# Patient Record
Sex: Female | Born: 1990 | Race: Black or African American | Hispanic: No | Marital: Single | Smoking: Former smoker
Health system: Southern US, Community
[De-identification: ages and names within clinical notes are randomized; demographics above are authoritative.]

## PROBLEM LIST (undated history)

## (undated) ENCOUNTER — Inpatient Hospital Stay (HOSPITAL_COMMUNITY): Payer: Self-pay

## (undated) DIAGNOSIS — Z789 Other specified health status: Secondary | ICD-10-CM

## (undated) HISTORY — PX: NO PAST SURGERIES: SHX2092

---

## 2011-12-24 ENCOUNTER — Other Ambulatory Visit: Payer: Self-pay | Admitting: Family Medicine

## 2011-12-24 DIAGNOSIS — M25569 Pain in unspecified knee: Secondary | ICD-10-CM

## 2012-01-01 ENCOUNTER — Telehealth: Payer: Self-pay | Admitting: Family Medicine

## 2012-01-01 ENCOUNTER — Ambulatory Visit
Admission: RE | Admit: 2012-01-01 | Discharge: 2012-01-01 | Disposition: A | Discharge status: Home / Self Care | Payer: BC Managed Care – PPO | Source: Ambulatory Visit | Attending: Internal Medicine | Admitting: Internal Medicine

## 2012-01-01 DIAGNOSIS — M25569 Pain in unspecified knee: Secondary | ICD-10-CM

## 2012-01-01 NOTE — Telephone Encounter (Signed)
Called patient about her MRI results.  Will f/u with me at [facility] sports clinic Monday afternoon.

## 2015-03-31 ENCOUNTER — Encounter (HOSPITAL_COMMUNITY): Payer: Self-pay | Admitting: Emergency Medicine

## 2015-03-31 ENCOUNTER — Emergency Department (HOSPITAL_COMMUNITY): Payer: BLUE CROSS/BLUE SHIELD

## 2015-03-31 ENCOUNTER — Emergency Department (HOSPITAL_COMMUNITY)
Admission: EM | Admit: 2015-03-31 | Discharge: 2015-03-31 | Disposition: A | Discharge status: Home / Self Care | Payer: BLUE CROSS/BLUE SHIELD | Attending: Emergency Medicine | Admitting: Emergency Medicine

## 2015-03-31 DIAGNOSIS — Y9389 Activity, other specified: Secondary | ICD-10-CM | POA: Diagnosis not present

## 2015-03-31 DIAGNOSIS — S96911A Strain of unspecified muscle and tendon at ankle and foot level, right foot, initial encounter: Secondary | ICD-10-CM | POA: Diagnosis not present

## 2015-03-31 DIAGNOSIS — S3991XA Unspecified injury of abdomen, initial encounter: Secondary | ICD-10-CM | POA: Insufficient documentation

## 2015-03-31 DIAGNOSIS — S20312A Abrasion of left front wall of thorax, initial encounter: Secondary | ICD-10-CM | POA: Insufficient documentation

## 2015-03-31 DIAGNOSIS — S39012A Strain of muscle, fascia and tendon of lower back, initial encounter: Secondary | ICD-10-CM | POA: Insufficient documentation

## 2015-03-31 DIAGNOSIS — S99911A Unspecified injury of right ankle, initial encounter: Secondary | ICD-10-CM | POA: Diagnosis present

## 2015-03-31 DIAGNOSIS — Z3202 Encounter for pregnancy test, result negative: Secondary | ICD-10-CM | POA: Insufficient documentation

## 2015-03-31 DIAGNOSIS — Y998 Other external cause status: Secondary | ICD-10-CM | POA: Insufficient documentation

## 2015-03-31 DIAGNOSIS — Y92410 Unspecified street and highway as the place of occurrence of the external cause: Secondary | ICD-10-CM | POA: Insufficient documentation

## 2015-03-31 LAB — POC URINE PREG, ED: Preg Test, Ur: NEGATIVE

## 2015-03-31 MED ORDER — METHOCARBAMOL 500 MG PO TABS
500.0000 mg | ORAL_TABLET | Freq: Once | ORAL | Status: AC
  Administered 2015-03-31: 500 mg via ORAL
  Filled 2015-03-31: qty 1

## 2015-03-31 MED ORDER — METHOCARBAMOL 500 MG PO TABS: 500.0000 mg | ORAL_TABLET | Freq: Two times a day (BID) | ORAL | Status: DC

## 2015-03-31 MED ORDER — NAPROXEN 500 MG PO TABS
500.0000 mg | ORAL_TABLET | Freq: Once | ORAL | Status: AC
  Administered 2015-03-31: 500 mg via ORAL
  Filled 2015-03-31: qty 1

## 2015-03-31 MED ORDER — NAPROXEN 500 MG PO TABS: 500.0000 mg | ORAL_TABLET | Freq: Two times a day (BID) | ORAL | Status: DC
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
## 2015-03-31 NOTE — ED Notes (Signed)
Patient transported to X-ray 

## 2015-03-31 NOTE — ED Notes (Signed)
Pt denies LOC, c/o tenderness to lower abdomen with palpation by Tresa EndoKelly, PA-C

## 2016-01-02 ENCOUNTER — Encounter (HOSPITAL_COMMUNITY): Payer: Self-pay

## 2016-01-02 ENCOUNTER — Emergency Department (HOSPITAL_COMMUNITY)
Admission: EM | Admit: 2016-01-02 | Discharge: 2016-01-02 | Disposition: A | Discharge status: Home / Self Care | Payer: BLUE CROSS/BLUE SHIELD | Attending: Emergency Medicine | Admitting: Emergency Medicine

## 2016-01-02 DIAGNOSIS — Z79899 Other long term (current) drug therapy: Secondary | ICD-10-CM | POA: Diagnosis not present

## 2016-01-02 DIAGNOSIS — H6691 Otitis media, unspecified, right ear: Secondary | ICD-10-CM | POA: Insufficient documentation

## 2016-01-02 DIAGNOSIS — H9201 Otalgia, right ear: Secondary | ICD-10-CM | POA: Diagnosis present

## 2016-01-02 MED ORDER — AMOXICILLIN-POT CLAVULANATE 875-125 MG PO TABS: 1.0000 | ORAL_TABLET | Freq: Two times a day (BID) | ORAL | 0 refills | Status: DC

## 2016-01-02 MED ORDER — AMOXICILLIN-POT CLAVULANATE 875-125 MG PO TABS
1.0000 | ORAL_TABLET | Freq: Once | ORAL | Status: AC
Start: 2016-01-02 — End: 2016-01-02
  Administered 2016-01-02: 1 via ORAL
  Filled 2016-01-02: qty 1

## 2016-01-02 MED ORDER — IBUPROFEN 800 MG PO TABS
800.0000 mg | ORAL_TABLET | Freq: Once | ORAL | Status: AC
  Administered 2016-01-02: 800 mg via ORAL
  Filled 2016-01-02: qty 1

## 2016-01-02 MED ORDER — IBUPROFEN 600 MG PO TABS
600.0000 mg | ORAL_TABLET | Freq: Four times a day (QID) | ORAL | 0 refills | Status: DC | PRN
Start: 2016-01-02 — End: 2017-09-28

## 2016-01-02 NOTE — ED Triage Notes (Signed)
Patient c/o ear pain that began today.  Patient states that ear feels like it is going to "pop".  Patient states has been up all night with pain.  Rates pain10/10

## 2016-01-02 NOTE — ED Provider Notes (Signed)
[facility]-EMERGENCY DEPT Provider Note   CSN: [id number] Arrival date & time: 01/02/16  [id number]     History   Chief Complaint Chief Complaint  Patient presents with  . Otalgia    HPI Alice Schmidt is a 25 y.o. female.  25 year old female presents to the emergency department for evaluation of sudden onset of right ear pain which began today. Symptoms preceded by upper respiratory symptoms including headaches, nasal congestion, and sore throat. Patient denies taking any medication for symptoms. No fevers noted prior to arrival.   The history is provided by the patient. No language interpreter was used.  Otalgia  This is a new problem. The current episode started 6 to 12 hours ago. There is pain in the right ear. The problem occurs constantly. The problem has been gradually worsening. There has been no fever. The pain is at a severity of 10/10. The pain is moderate. Associated symptoms include headaches and sore throat. Pertinent negatives include no ear discharge, no neck pain and no rash. Her past medical history does not include chronic ear infection or hearing loss.    History reviewed. No pertinent past medical history.  There are no active problems to display for this patient.   History reviewed. No pertinent surgical history.  OB History    No data available       Home Medications    Prior to Admission medications   Medication Sig Start Date End Date Taking? Authorizing Provider  fluocinonide ointment (LIDEX) 0.05 % Apply 1 application topically 2 (two) times daily as needed (for ezcema.).   Yes Historical Provider, MD  ONEXTON 1.2-3.75 % GEL Apply 1 application topically every morning. 12/23/15  Yes Historical Provider, MD  RETIN-A MICRO PUMP 0.08 % GEL Apply 1 application topically at bedtime. 12/23/15  Yes Historical Provider, MD  amoxicillin-clavulanate (AUGMENTIN) 875-125 MG tablet Take 1 tablet by mouth every 12 (twelve) hours. 01/02/16   Antony MaduraKelly Hagan Vanauken, PA-C  ibuprofen  (ADVIL,MOTRIN) 600 MG tablet Take 1 tablet (600 mg total) by mouth every 6 (six) hours as needed. 01/02/16   Antony MaduraKelly Enya Bureau, PA-C    Family History No family history on file.  Social History Social History  Substance Use Topics  . Smoking status: Never Smoker  . Smokeless tobacco: Never Used  . Alcohol use No     Allergies   Review of patient's allergies indicates no known allergies.   Review of Systems Review of Systems  HENT: Positive for ear pain and sore throat. Negative for ear discharge.   Musculoskeletal: Negative for neck pain.  Skin: Negative for rash.  Neurological: Positive for headaches.  Ten systems reviewed and are negative for acute change, except as noted in the HPI.     Physical Exam Updated Vital Signs BP 120/89 (BP Location: Left Arm)   Pulse 92   Temp 98.1 F (36.7 C) (Oral)   Resp 19   Ht 5\' 4"  (1.626 m)   Wt 54 kg   LMP 12/30/2015   SpO2 100%   BMI 20.43 kg/m   Physical Exam  Constitutional: She is oriented to person, place, and time. She appears well-developed and well-nourished. No distress.  Nontoxic appearing. Tearful.  HENT:  Head: Normocephalic and atraumatic.  Patient with erythematous and dull, slightly bulging right tympanic membrane. No evidence of perforation. No mastoid swelling, tenderness, or erythema bilaterally. No hemotympanum. Uvula midline. Patient tolerating secretions without difficulty. Oropharynx without exudates. No tripoding.  Eyes: Conjunctivae and EOM are normal. No scleral icterus.  Neck:  Normal range of motion.  No nuchal rigidity or meningismus  Cardiovascular: Normal rate, regular rhythm and intact distal pulses.   Pulmonary/Chest: Effort normal. No respiratory distress.  Respirations even and unlabored  Musculoskeletal: Normal range of motion.  Neurological: She is alert and oriented to person, place, and time.  Skin: Skin is warm and dry. No rash noted. She is not diaphoretic. No erythema. No pallor.    Psychiatric: Her behavior is normal.  Nursing note and vitals reviewed.    ED Treatments / Results  Labs (all labs ordered are listed, but only abnormal results are displayed) Labs Reviewed - No data to display  EKG  EKG Interpretation None       Radiology No results found.  Procedures Procedures (including critical care time)  Medications Ordered in ED Medications  ibuprofen (ADVIL,MOTRIN) tablet 800 mg (800 mg Oral Given 01/02/16 0449)  amoxicillin-clavulanate (AUGMENTIN) 875-125 MG per tablet 1 tablet (1 tablet Oral Given 01/02/16 0449)     Initial Impression / Assessment and Plan / ED Course  I have reviewed the triage vital signs and the nursing notes.  Pertinent labs & imaging results that were available during my care of the patient were reviewed by me and considered in my medical decision making (see chart for details).  Clinical Course    Patient presents with otalgia and exam consistent with acute otitis media. No concern for acute mastoiditis, meningitis. No antibiotic use in the last month. Patient discharged home with Augmentin. Advised patient to call PCP today for follow-up. I have also discussed reasons to return immediately to the ER. Patient expresses understanding and agrees with plan. Patient discharged in stable condition with no unaddressed concerns.   Final Clinical Impressions(s) / ED Diagnoses   Final diagnoses:  Right otitis media, recurrence not specified, unspecified chronicity, unspecified otitis media type    New Prescriptions New Prescriptions   AMOXICILLIN-CLAVULANATE (AUGMENTIN) 875-125 MG TABLET    Take 1 tablet by mouth every 12 (twelve) hours.   IBUPROFEN (ADVIL,MOTRIN) 600 MG TABLET    Take 1 tablet (600 mg total) by mouth every 6 (six) hours as needed.     Antony Madura, PA-C 01/02/16 1610    Devoria Albe, MD 01/02/16 [id number]

## 2016-01-20 ENCOUNTER — Other Ambulatory Visit (HOSPITAL_COMMUNITY)
Admission: RE | Admit: 2016-01-20 | Discharge: 2016-01-20 | Disposition: A | Discharge status: Home / Self Care | Payer: BLUE CROSS/BLUE SHIELD | Source: Ambulatory Visit | Attending: Family Medicine | Admitting: Family Medicine

## 2016-01-20 ENCOUNTER — Other Ambulatory Visit: Payer: Self-pay | Admitting: Family Medicine

## 2016-01-20 DIAGNOSIS — Z113 Encounter for screening for infections with a predominantly sexual mode of transmission: Secondary | ICD-10-CM | POA: Insufficient documentation

## 2016-01-20 DIAGNOSIS — Z01419 Encounter for gynecological examination (general) (routine) without abnormal findings: Secondary | ICD-10-CM | POA: Insufficient documentation

## 2016-01-23 LAB — CYTOLOGY - PAP

## 2017-09-28 ENCOUNTER — Inpatient Hospital Stay (HOSPITAL_COMMUNITY)
Admission: AD | Admit: 2017-09-28 | Discharge: 2017-09-28 | Disposition: A | Discharge status: Home / Self Care | Payer: Self-pay | Source: Ambulatory Visit | Attending: Obstetrics and Gynecology | Admitting: Obstetrics and Gynecology

## 2017-09-28 ENCOUNTER — Encounter (HOSPITAL_COMMUNITY): Payer: Self-pay | Admitting: *Deleted

## 2017-09-28 ENCOUNTER — Inpatient Hospital Stay (HOSPITAL_COMMUNITY): Payer: Self-pay

## 2017-09-28 DIAGNOSIS — Z87891 Personal history of nicotine dependence: Secondary | ICD-10-CM | POA: Insufficient documentation

## 2017-09-28 DIAGNOSIS — O021 Missed abortion: Secondary | ICD-10-CM | POA: Insufficient documentation

## 2017-09-28 DIAGNOSIS — Z3A08 8 weeks gestation of pregnancy: Secondary | ICD-10-CM

## 2017-09-28 HISTORY — DX: Other specified health status: Z78.9

## 2017-09-28 LAB — WET PREP, GENITAL
Clue Cells Wet Prep HPF POC: NONE SEEN
Sperm: NONE SEEN
TRICH WET PREP: NONE SEEN
Yeast Wet Prep HPF POC: NONE SEEN

## 2017-09-28 LAB — CBC
HCT: 38.1 % (ref 36.0–46.0)
Hemoglobin: 13.1 g/dL (ref 12.0–15.0)
MCH: 31.6 pg (ref 26.0–34.0)
MCHC: 34.4 g/dL (ref 30.0–36.0)
MCV: 91.8 fL (ref 78.0–100.0)
PLATELETS: 228 10*3/uL (ref 150–400)
RBC: 4.15 MIL/uL (ref 3.87–5.11)
RDW: 13.9 % (ref 11.5–15.5)
WBC: 4.3 10*3/uL (ref 4.0–10.5)

## 2017-09-28 LAB — URINALYSIS, ROUTINE W REFLEX MICROSCOPIC
Bilirubin Urine: NEGATIVE
GLUCOSE, UA: NEGATIVE mg/dL
KETONES UR: NEGATIVE mg/dL
Nitrite: NEGATIVE
PROTEIN: NEGATIVE mg/dL
Specific Gravity, Urine: 1.025 (ref 1.005–1.030)
pH: 6 (ref 5.0–8.0)

## 2017-09-28 LAB — HCG, QUANTITATIVE, PREGNANCY: HCG, BETA CHAIN, QUANT, S: 12226 m[IU]/mL — AB (ref <5)

## 2017-09-28 LAB — ABO/RH: ABO/RH(D): A POS

## 2017-09-28 LAB — POCT PREGNANCY, URINE: PREG TEST UR: POSITIVE — AB

## 2017-09-28 MED ORDER — PROMETHAZINE HCL 25 MG PO TABS: 25.0000 mg | ORAL_TABLET | Freq: Four times a day (QID) | ORAL | 0 refills | Status: AC | PRN

## 2017-09-28 MED ORDER — IBUPROFEN 600 MG PO TABS: 600.0000 mg | ORAL_TABLET | Freq: Four times a day (QID) | ORAL | 0 refills | Status: AC | PRN

## 2017-09-28 MED ORDER — MISOPROSTOL 200 MCG PO TABS: 800.0000 ug | ORAL_TABLET | Freq: Once | ORAL | 0 refills | Status: AC

## 2017-09-28 MED ORDER — OXYCODONE-ACETAMINOPHEN 5-325 MG PO TABS: 1.0000 | ORAL_TABLET | Freq: Four times a day (QID) | ORAL | 0 refills | Status: AC | PRN

## 2017-09-28 NOTE — Discharge Instructions (Signed)

## 2017-09-28 NOTE — MAU Note (Signed)
Found out this past wk I am pregnant. Mild cramping last night but none now. Some bright red spotting this am.

## 2017-09-28 NOTE — MAU Provider Note (Addendum)
History     CSN: [id number]  Arrival date and time: 09/28/17 5621   First Provider Initiated Contact with Patient 09/28/17 [phone]      Chief Complaint  Patient presents with  . Vaginal Bleeding   Vaginal Bleeding  The patient's primary symptoms include pelvic pain and vaginal bleeding. This is a new problem. The current episode started yesterday. The problem occurs intermittently. The problem has been unchanged. Pain severity now: 4/10. The problem affects both sides. She is pregnant. Pertinent negatives include no chills, dysuria, fever, frequency, nausea, urgency or vomiting. The vaginal discharge was bloody. The vaginal bleeding is spotting. She has not been passing clots. She has not been passing tissue. Nothing aggravates the symptoms. She has tried nothing for the symptoms. She uses nothing for contraception. Her menstrual history has been regular (LMP 07/13/17).   Past Medical History:  Diagnosis Date  . Medical history non-contributory     Past Surgical History:  Procedure Laterality Date  . NO PAST SURGERIES      No family history on file.  Social History   Tobacco Use  . Smoking status: Former Games developer  . Smokeless tobacco: Never Used  Substance Use Topics  . Alcohol use: Yes  . Drug use: Yes    Types: Marijuana    Comment: last smoked about 9 days    Allergies: No Known Allergies  Medications Prior to Admission  Medication Sig Dispense Refill Last Dose  . amoxicillin-clavulanate (AUGMENTIN) 875-125 MG tablet Take 1 tablet by mouth every 12 (twelve) hours. 14 tablet 0   . fluocinonide ointment (LIDEX) 0.05 % Apply 1 application topically 2 (two) times daily as needed (for ezcema.).   01/01/2016 at Unknown time  . ibuprofen (ADVIL,MOTRIN) 600 MG tablet Take 1 tablet (600 mg total) by mouth every 6 (six) hours as needed. 30 tablet 0   . ONEXTON 1.2-3.75 % GEL Apply 1 application topically every morning.  3 12/29/2015  . RETIN-A MICRO PUMP 0.08 % GEL Apply 1  application topically at bedtime.  2 01/01/2016 at Unknown time    Review of Systems  Constitutional: Negative for chills and fever.  Gastrointestinal: Negative for nausea and vomiting.  Genitourinary: Positive for pelvic pain and vaginal bleeding. Negative for dysuria, frequency and urgency.   Physical Exam   Blood pressure 101/65, pulse 81, temperature 98.2 F (36.8 C), resp. rate 18, height 5\' 4"  (1.626 m), weight 119 lb (54 kg), last menstrual period 07/13/2017.  Physical Exam  Nursing note and vitals reviewed. Constitutional: She is oriented to person, place, and time. She appears well-developed and well-nourished. No distress.  HENT:  Head: Normocephalic.  Cardiovascular: Normal rate.  Respiratory: Effort normal.  GI: Soft. There is no tenderness. There is no rebound.  Neurological: She is alert and oriented to person, place, and time.  Skin: Skin is warm and dry.  Psychiatric: She has a normal mood and affect.    MAU Course  Procedures Results for orders placed or performed during the hospital encounter of 09/28/17 (from the past 24 hour(s))  Urinalysis, Routine w reflex microscopic     Status: Abnormal   Collection Time: 09/28/17  6:45 AM  Result Value Ref Range   Color, Urine YELLOW YELLOW   APPearance HAZY (A) CLEAR   Specific Gravity, Urine 1.025 1.005 - 1.030   pH 6.0 5.0 - 8.0   Glucose, UA NEGATIVE NEGATIVE mg/dL   Hgb urine dipstick MODERATE (A) NEGATIVE   Bilirubin Urine NEGATIVE NEGATIVE   Ketones,  ur NEGATIVE NEGATIVE mg/dL   Protein, ur NEGATIVE NEGATIVE mg/dL   Nitrite NEGATIVE NEGATIVE   Leukocytes, UA SMALL (A) NEGATIVE   RBC / HPF 6-10 0 - 5 RBC/hpf   WBC, UA 6-10 0 - 5 WBC/hpf   Bacteria, UA MANY (A) NONE SEEN   Squamous Epithelial / LPF 6-10 0 - 5   Mucus PRESENT   Pregnancy, urine POC     Status: Abnormal   Collection Time: 09/28/17  7:00 AM  Result Value Ref Range   Preg Test, Ur POSITIVE (A) NEGATIVE  CBC     Status: None   Collection  Time: 09/28/17  7:34 AM  Result Value Ref Range   WBC 4.3 4.0 - 10.5 K/uL   RBC 4.15 3.87 - 5.11 MIL/uL   Hemoglobin 13.1 12.0 - 15.0 g/dL   HCT 14.7 82.9 - 56.2 %   MCV 91.8 78.0 - 100.0 fL   MCH 31.6 26.0 - 34.0 pg   MCHC 34.4 30.0 - 36.0 g/dL   RDW 13.0 86.5 - 78.4 %   Platelets 228 150 - 400 K/uL  hCG, quantitative, pregnancy     Status: Abnormal   Collection Time: 09/28/17  7:34 AM  Result Value Ref Range   hCG, Beta Chain, Quant, S 12,226 (H) <5 mIU/mL  ABO/Rh     Status: None   Collection Time: 09/28/17  7:34 AM  Result Value Ref Range   ABO/RH(D)      A POS Performed at [facility], [address], [place], [place] [address]   Wet prep, genital     Status: Abnormal   Collection Time: 09/28/17  7:35 AM  Result Value Ref Range   Yeast Wet Prep HPF POC NONE SEEN NONE SEEN   Trich, Wet Prep NONE SEEN NONE SEEN   Clue Cells Wet Prep HPF POC NONE SEEN NONE SEEN   WBC, Wet Prep HPF POC FEW (A) NONE SEEN   Sperm NONE SEEN    US Ob Comp Less 14 Wks  Result Date: 09/28/2017 CLINICAL DATA:  Pelvic pain, spotting EXAM: OBSTETRIC <14 WK ULTRASOUND TECHNIQUE: Transabdominal ultrasound was performed for evaluation of the gestation as well as the maternal uterus and adnexal regions. COMPARISON:  None. FINDINGS: Intrauterine gestational sac: Single Yolk sac:  Visualized, appears collapsed Embryo:  Visualized Cardiac Activity: Not visualized Heart Rate:  bpm MSD:   mm    w     d CRL:   23 mm   8 w 6 d                  [place] EDC: Subchorionic hemorrhage:  None visualized. Maternal uterus/adnexae: No adnexal mass or free fluid. IMPRESSION: Eight week 6 day intrauterine pregnancy. No detectable fetal heart tones. Findings meet definitive criteria for failed pregnancy. This follows SRU consensus guidelines: Diagnostic Criteria for Nonviable Pregnancy Early in the First Trimester. [place] Mis J Med [phone]. Electronically Signed   By: Charlett Nose M.D.   On: 09/28/2017 08:12    MDM 0800:  Care turned over to Judeth Horn, NP Thressa Sheller 8:08 AM 09/28/17    Ultrasound shows IUP measuring [redacted]w[redacted]d without cardiac activity, definitive for nonviable pregnancy. HGB 13.1  Discussed options for management of incomplete AB including expectant management, Cytotec or D&C. Prefers expectant management at this time. Verbalizes understanding that intervention may become necessary if SAB in not completed spontaneously or if heavy bleeding or infection occur.   She would like rx for cytotec but is  unsure yet if she will take it. Given instructions and precautions regarding use of cytotec.   Assessment and Plan  A: 1. Missed abortion with fetal demise before 20 completed weeks of gestation   2. [redacted] weeks gestation of pregnancy    P: Discharge home Rx cytotec, percocet, phenergan, ibuprofen Discussed reasons to return to MAU Discussed how to use cytotec and expectations Msg to [facility] for follow up next week  Judeth HornLawrence, [place] Marcucci, NP

## 2017-09-30 LAB — GC/CHLAMYDIA PROBE AMP (~~LOC~~) NOT AT ARMC
CHLAMYDIA, DNA PROBE: NEGATIVE
NEISSERIA GONORRHEA: NEGATIVE

## 2017-10-04 ENCOUNTER — Other Ambulatory Visit: Payer: Self-pay | Admitting: *Deleted

## 2017-10-04 DIAGNOSIS — O039 Complete or unspecified spontaneous abortion without complication: Secondary | ICD-10-CM

## 2017-10-07 ENCOUNTER — Other Ambulatory Visit: Payer: Self-pay

## 2017-10-07 DIAGNOSIS — O039 Complete or unspecified spontaneous abortion without complication: Secondary | ICD-10-CM

## 2017-10-08 LAB — BETA HCG QUANT (REF LAB): HCG QUANT: 1463 m[IU]/mL

## 2017-10-09 ENCOUNTER — Telehealth: Payer: Self-pay | Admitting: *Deleted

## 2017-10-09 NOTE — Telephone Encounter (Signed)
Tristen called today and left a message she is calling re: results for lab from Monday re: miscarriage.  I called Teliah and left a message I am returning her call and she may call us back.

## 2017-10-22 ENCOUNTER — Ambulatory Visit: Payer: Self-pay | Admitting: Advanced Practice Midwife

## 2018-07-12 ENCOUNTER — Encounter (HOSPITAL_COMMUNITY): Payer: Self-pay

## 2020-06-13 IMAGING — US US OB COMP LESS 14 WK
1 series · 15 of 28 positions shown · non-contrast
Comparison: None.

CLINICAL DATA: Pelvic pain, spotting

EXAM:
OBSTETRIC <14 WK ULTRASOUND
TECHNIQUE: Transabdominal ultrasound was performed for evaluation of the
gestation as well as the maternal uterus and adnexal regions.

[Series 1: us ob comp less 14 wk · 15 of 41 slices shown]
[im 1/41]
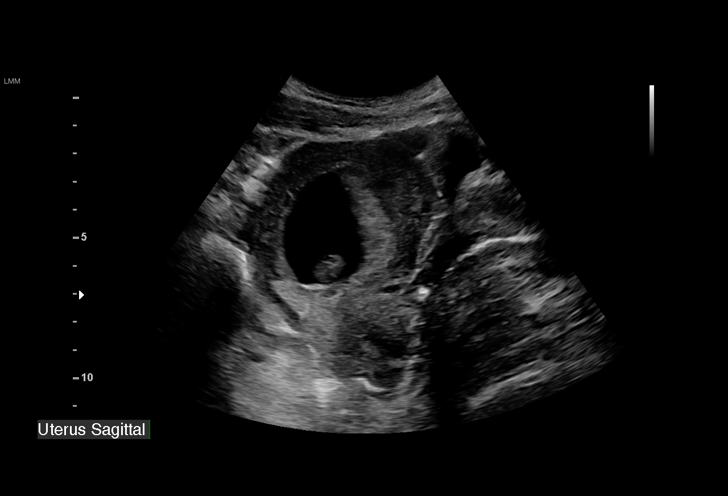
[im 3/41]
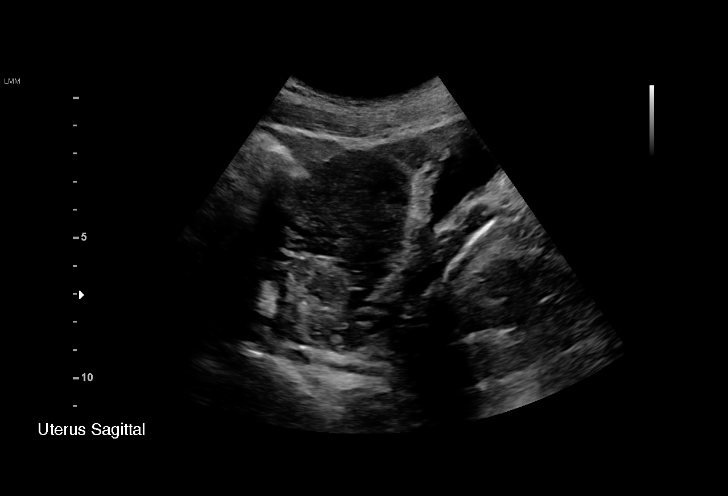
[im 6/41]
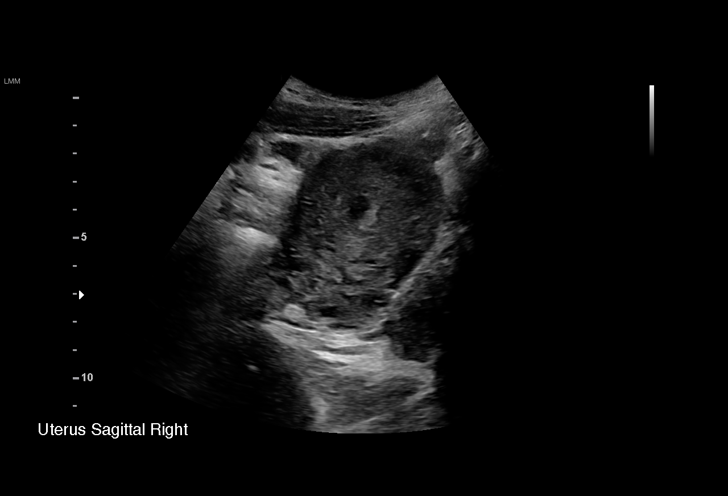
[im 9/41]
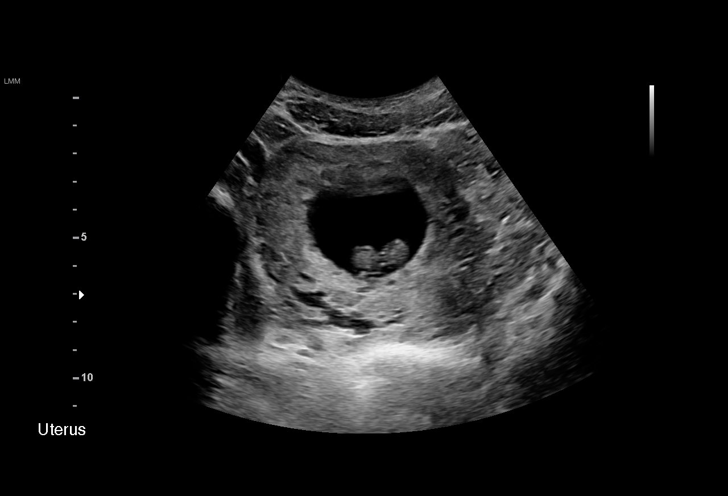
[im 12/41]
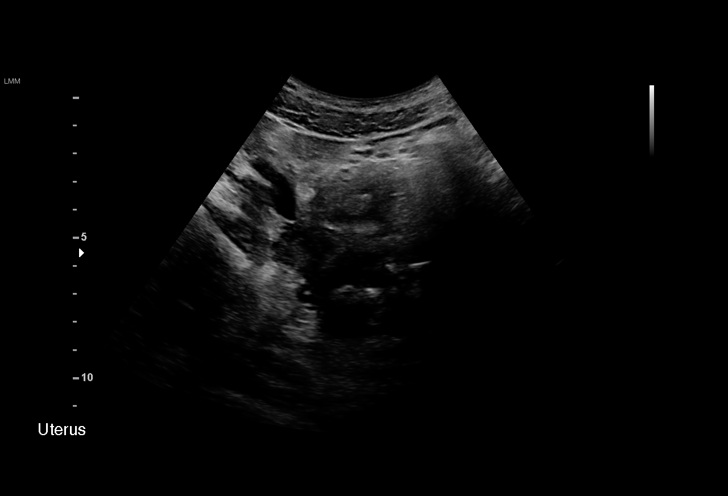
[im 15/41]
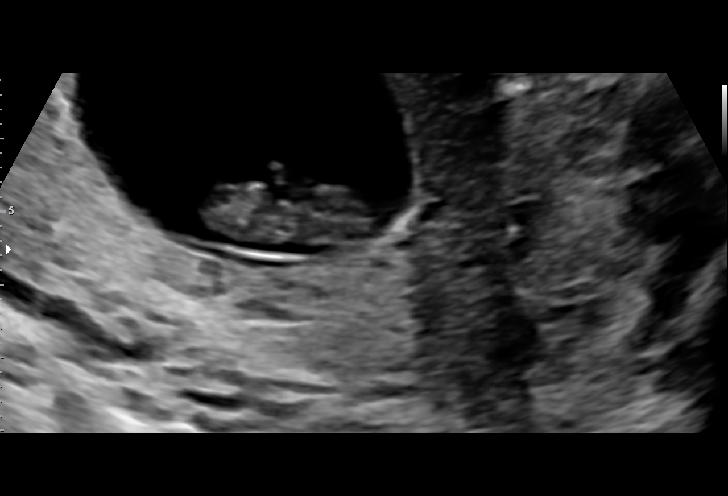
[im 18/41]
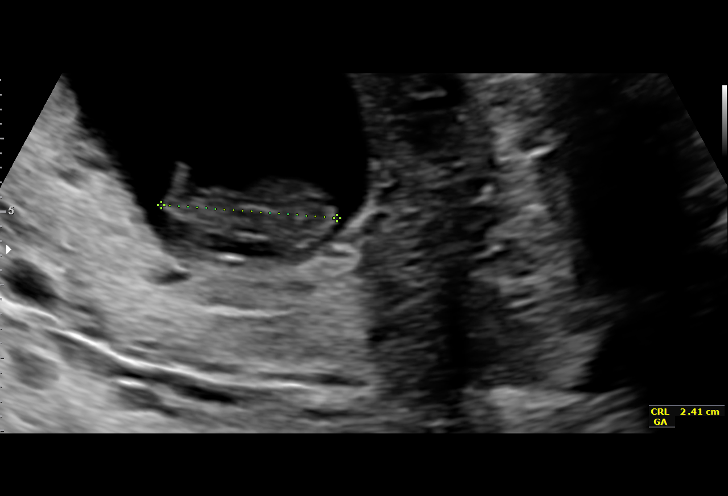
[im 21/41]
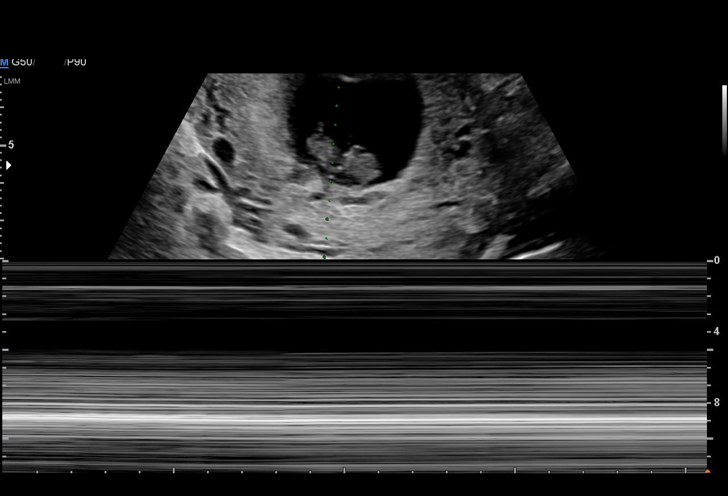
[im 23/41]
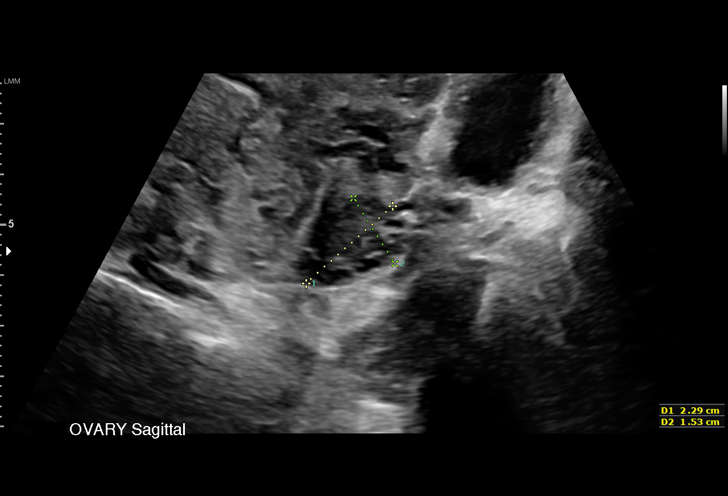
[im 26/41]
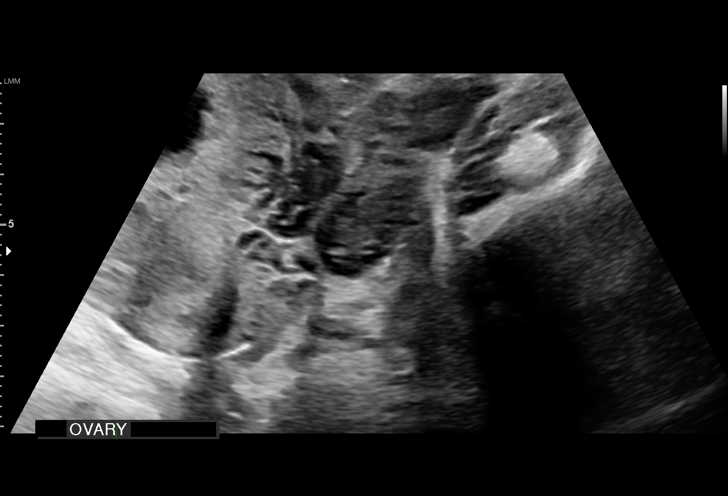
[im 29/41]
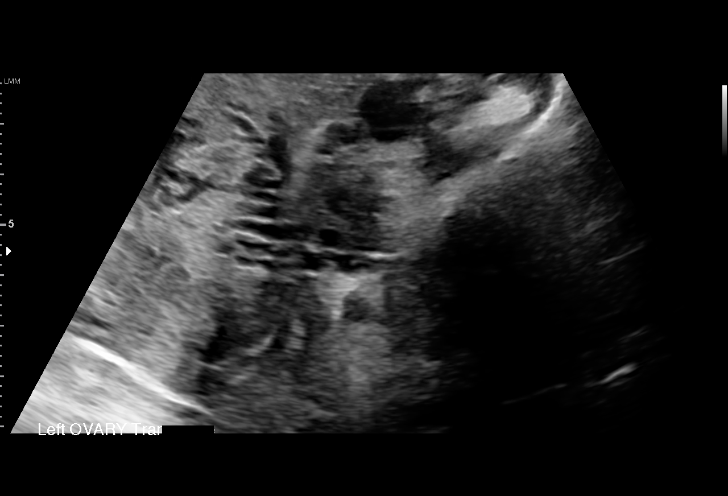
[im 32/41]
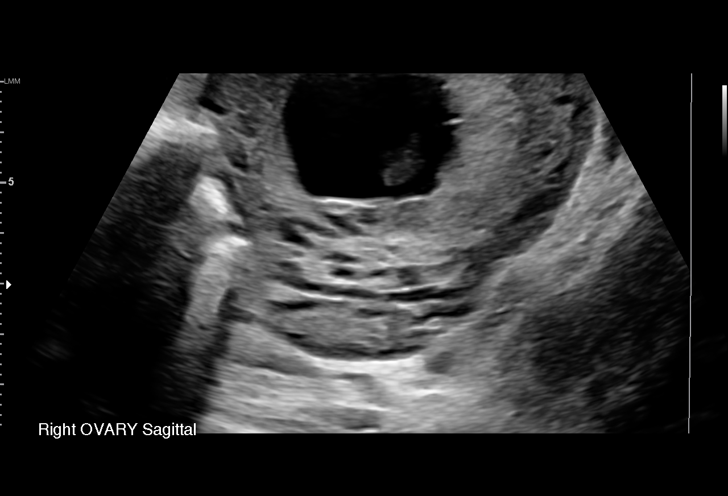
[im 35/41]
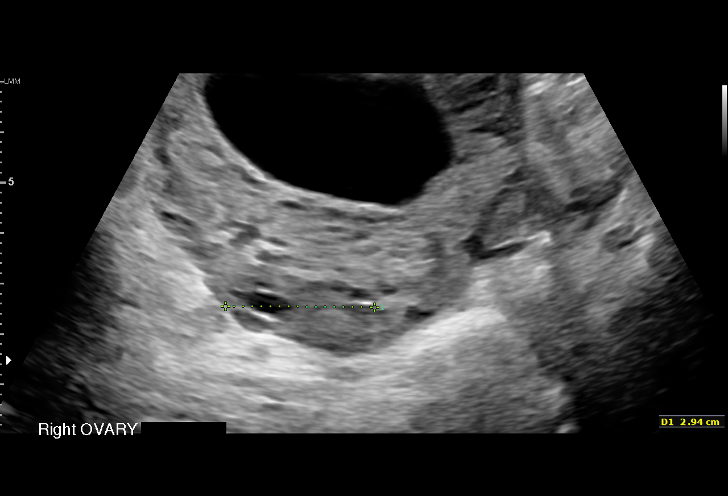
[im 38/41]
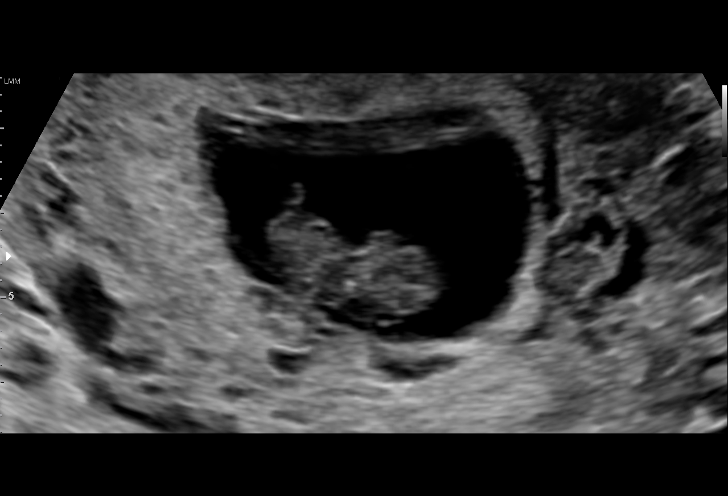
[im 41/41]
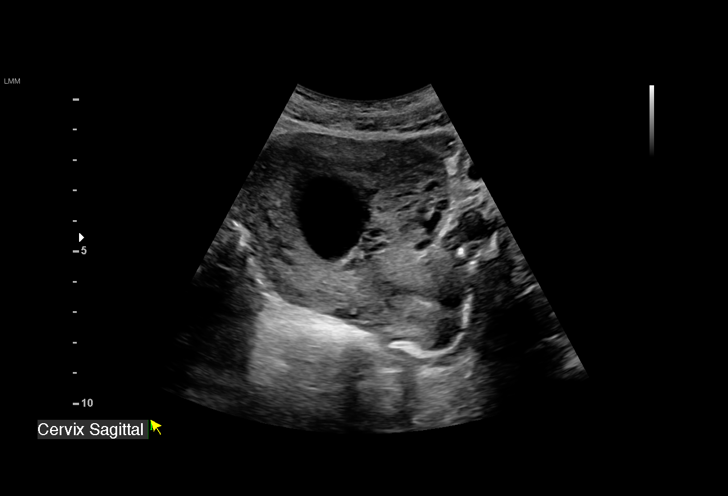

[15 of 28 positions shown; findings below may reference images not displayed]

FINDINGS: Intrauterine gestational sac: Single

Yolk sac:  Visualized, appears collapsed

Embryo:  Visualized

Cardiac Activity: Not visualized

Heart Rate:  bpm

MSD:   mm    w     d

CRL:   23 mm   8 w 6 d                  US EDC:

Subchorionic hemorrhage:  None visualized.

Maternal uterus/adnexae: No adnexal mass or free fluid.
IMPRESSION: Eight week 6 day intrauterine pregnancy. No detectable fetal heart
tones. Findings meet definitive criteria for failed pregnancy. This
follows SRU consensus guidelines: Diagnostic Criteria for Nonviable
Pregnancy Early in the First Trimester. N Engl J Med
